# Patient Record
Sex: Male | Born: 1980 | Race: White | Hispanic: No
Health system: Southern US, Community
[De-identification: ages and names within clinical notes are randomized; demographics above are authoritative.]

## PROBLEM LIST (undated history)

## (undated) DIAGNOSIS — J45909 Unspecified asthma, uncomplicated: Secondary | ICD-10-CM

---

## 2021-06-05 ENCOUNTER — Emergency Department: Payer: Self-pay

## 2021-06-05 ENCOUNTER — Emergency Department
Admission: EM | Admit: 2021-06-05 | Discharge: 2021-06-05 | Disposition: A | Discharge status: Home / Self Care | Payer: Self-pay | Attending: Emergency Medicine | Admitting: Emergency Medicine

## 2021-06-05 ENCOUNTER — Encounter: Payer: Self-pay | Admitting: Radiology

## 2021-06-05 DIAGNOSIS — R0902 Hypoxemia: Secondary | ICD-10-CM

## 2021-06-05 DIAGNOSIS — J45901 Unspecified asthma with (acute) exacerbation: Secondary | ICD-10-CM | POA: Insufficient documentation

## 2021-06-05 DIAGNOSIS — Z20822 Contact with and (suspected) exposure to covid-19: Secondary | ICD-10-CM | POA: Insufficient documentation

## 2021-06-05 HISTORY — DX: Unspecified asthma, uncomplicated: J45.909

## 2021-06-05 LAB — CBC WITH DIFFERENTIAL/PLATELET
Abs Immature Granulocytes: 0.03 10*3/uL (ref 0.00–0.07)
Basophils Absolute: 0.1 10*3/uL (ref 0.0–0.1)
Basophils Relative: 2 %
Eosinophils Absolute: 1.6 10*3/uL — ABNORMAL HIGH (ref 0.0–0.5)
Eosinophils Relative: 23 %
HCT: 47.3 % (ref 39.0–52.0)
Hemoglobin: 16.2 g/dL (ref 13.0–17.0)
Immature Granulocytes: 0 %
Lymphocytes Relative: 35 %
Lymphs Abs: 2.3 10*3/uL (ref 0.7–4.0)
MCH: 29.8 pg (ref 26.0–34.0)
MCHC: 34.2 g/dL (ref 30.0–36.0)
MCV: 87.1 fL (ref 80.0–100.0)
Monocytes Absolute: 0.5 10*3/uL (ref 0.1–1.0)
Monocytes Relative: 7 %
Neutro Abs: 2.3 10*3/uL (ref 1.7–7.7)
Neutrophils Relative %: 33 %
Platelets: 253 10*3/uL (ref 150–400)
RBC: 5.43 MIL/uL (ref 4.22–5.81)
RDW: 12.6 % (ref 11.5–15.5)
WBC: 6.8 10*3/uL (ref 4.0–10.5)
nRBC: 0 % (ref 0.0–0.2)

## 2021-06-05 LAB — RESP PANEL BY RT-PCR (FLU A&B, COVID) ARPGX2
Influenza A by PCR: NEGATIVE
Influenza B by PCR: NEGATIVE
SARS Coronavirus 2 by RT PCR: NEGATIVE

## 2021-06-05 LAB — BASIC METABOLIC PANEL
Anion gap: 6 (ref 5–15)
BUN: 12 mg/dL (ref 6–20)
CO2: 26 mmol/L (ref 22–32)
Calcium: 9 mg/dL (ref 8.9–10.3)
Chloride: 109 mmol/L (ref 98–111)
Creatinine, Ser: 0.77 mg/dL (ref 0.61–1.24)
GFR, Estimated: >60 mL/min (ref >60)
Glucose, Bld: 147 mg/dL — ABNORMAL HIGH (ref 70–99)
Potassium: 4.4 mmol/L (ref 3.5–5.1)
Sodium: 141 mmol/L (ref 135–145)

## 2021-06-05 LAB — BLOOD GAS, ARTERIAL
Acid-base deficit: 0.7 mmol/L (ref 0.0–2.0)
Bicarbonate: 25.4 mmol/L (ref 20.0–28.0)
FIO2: 10
O2 Saturation: 98.8 %
Patient temperature: 37
pCO2 arterial: 46 mmHg (ref 32.0–48.0)
pH, Arterial: 7.35 (ref 7.350–7.450)
pO2, Arterial: 129 mmHg — ABNORMAL HIGH (ref 83.0–108.0)

## 2021-06-05 MED ORDER — ALBUTEROL SULFATE (2.5 MG/3ML) 0.083% IN NEBU
5.0000 mg | INHALATION_SOLUTION | RESPIRATORY_TRACT | Status: AC
  Administered 2021-06-05 (×3): 5 mg via RESPIRATORY_TRACT
  Filled 2021-06-05: qty 6

## 2021-06-05 MED ORDER — IPRATROPIUM BROMIDE 0.02 % IN SOLN
0.5000 mg | RESPIRATORY_TRACT | Status: AC
  Administered 2021-06-05 (×3): 0.5 mg via RESPIRATORY_TRACT
  Filled 2021-06-05: qty 2.5

## 2021-06-05 MED ORDER — ALBUTEROL SULFATE HFA 108 (90 BASE) MCG/ACT IN AERS
2.0000 | INHALATION_SPRAY | Freq: Four times a day (QID) | RESPIRATORY_TRACT | Status: DC | PRN
  Filled 2021-06-05: qty 6.7

## 2021-06-05 MED ORDER — PREDNISONE 20 MG PO TABS: 60.0000 mg | ORAL_TABLET | Freq: Every day | ORAL | 0 refills | Status: DC

## 2021-06-05 MED ORDER — ALBUTEROL SULFATE (2.5 MG/3ML) 0.083% IN NEBU: 2.5000 mg | INHALATION_SOLUTION | RESPIRATORY_TRACT | 2 refills | Status: DC | PRN

## 2021-06-05 MED ORDER — MAGNESIUM SULFATE 2 GM/50ML IV SOLN
2.0000 g | Freq: Once | INTRAVENOUS | Status: AC
  Administered 2021-06-05: 2 g via INTRAVENOUS
  Filled 2021-06-05: qty 50

## 2021-06-05 MED ORDER — SODIUM CHLORIDE 0.9 % IV BOLUS (SEPSIS)
1000.0000 mL | Freq: Once | INTRAVENOUS | Status: AC
  Administered 2021-06-05: 1000 mL via INTRAVENOUS

## 2021-06-05 MED ORDER — ALBUTEROL SULFATE (2.5 MG/3ML) 0.083% IN NEBU: 3.0000 mL | INHALATION_SOLUTION | Freq: Once | RESPIRATORY_TRACT | Status: DC

## 2021-06-05 MED ORDER — ALBUTEROL SULFATE HFA 108 (90 BASE) MCG/ACT IN AERS: 2.0000 | INHALATION_SPRAY | RESPIRATORY_TRACT | 2 refills | Status: DC | PRN

## 2021-06-05 MED ORDER — METHYLPREDNISOLONE SODIUM SUCC 125 MG IJ SOLR
125.0000 mg | Freq: Once | INTRAMUSCULAR | Status: AC
  Administered 2021-06-05: 125 mg via INTRAVENOUS
  Filled 2021-06-05: qty 2

## 2021-06-05 NOTE — Discharge Instructions (Signed)
Steps to find a Primary Care Provider (PCP):  Call [phone] or [phone] to access "[facility] Find a Doctor Service."  2.  You may also go on the [facility] website at [email or web address]  

## 2021-06-05 NOTE — ED Triage Notes (Signed)
Patient arrives via EMS for shortness of breath after waking up partner around 1:00 asking to go to pharmacy to get refill of albuterol. Symptoms rapidly progressed until patient could no longer speak in complete sentences. Patient arrives with saturation in the 80's on room air, immediately started on breathing treatment via face mask. MD at bedside for eval. Patient is awake and alert, handling own secretions, anxious and asking for help.
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
## 2021-12-28 IMAGING — DX DG CHEST 1V PORT
1 series · 1 of 1 positions shown · non-contrast
Comparison: None.

CLINICAL DATA: Shortness of breath.

EXAM:
PORTABLE CHEST 1 VIEW

[chest ap]
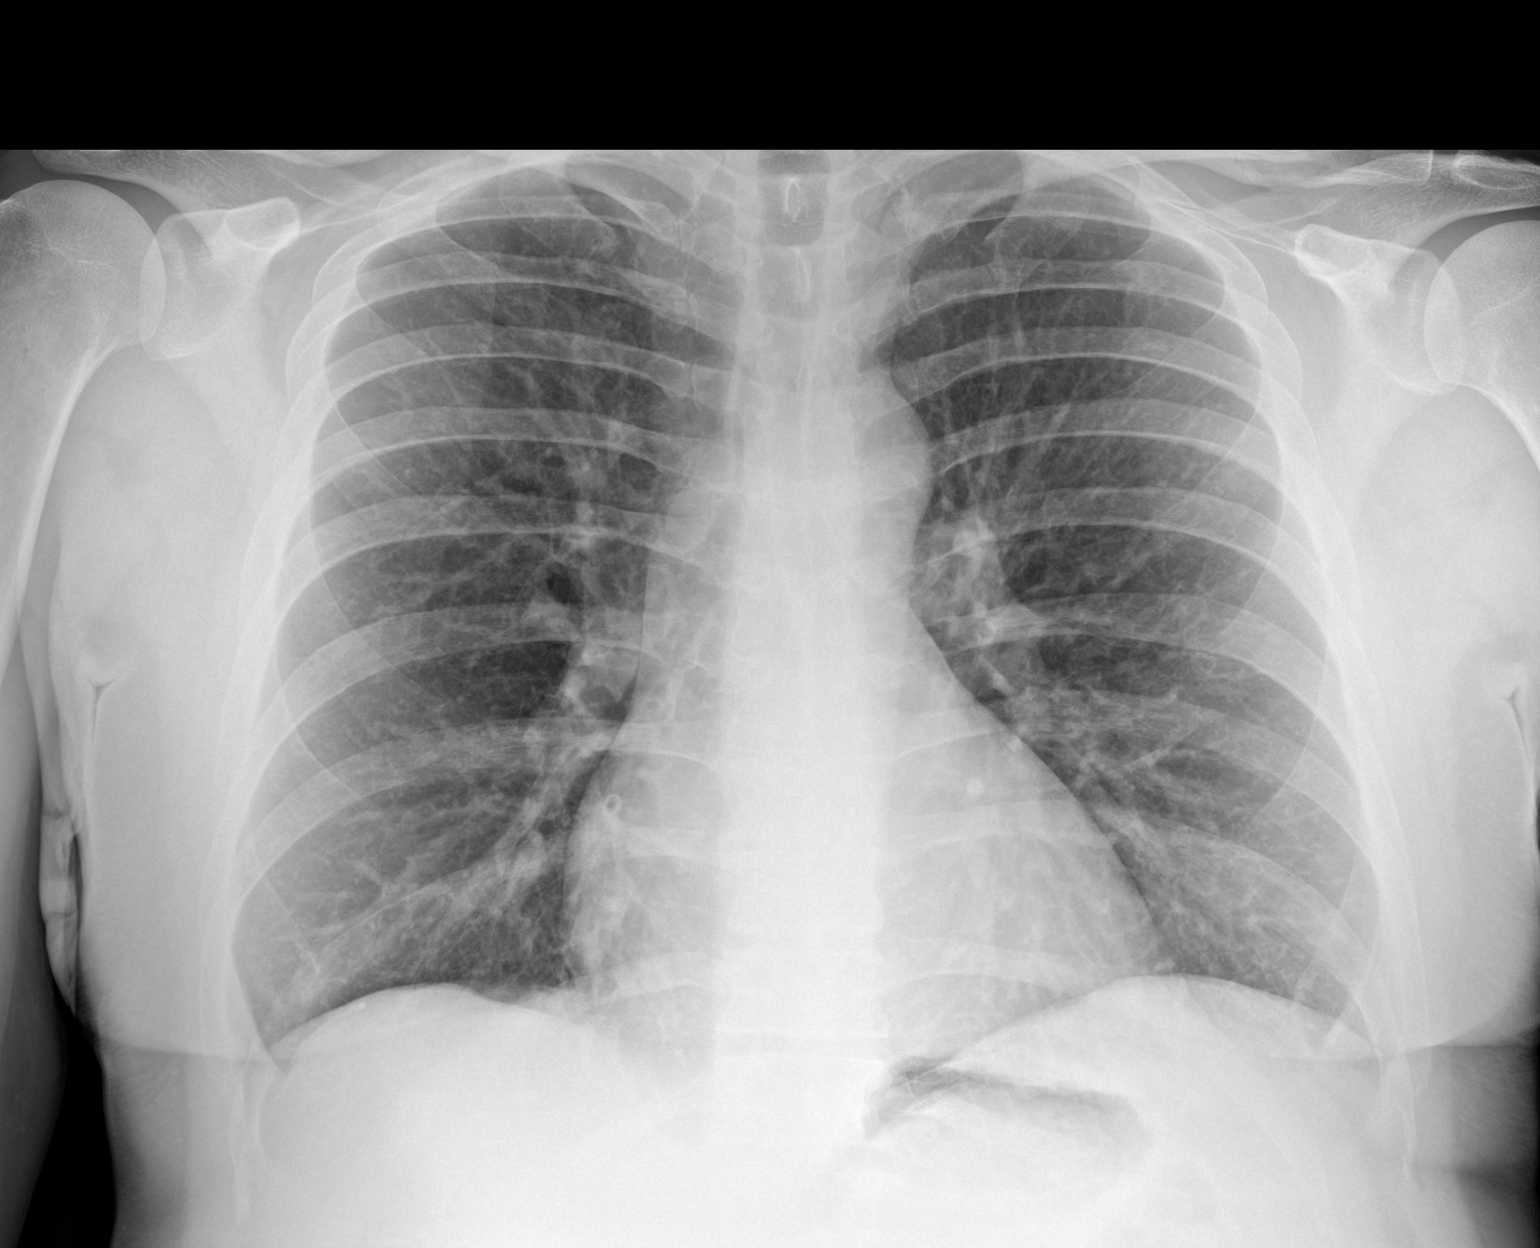

[1 of 1 positions shown; findings below may reference images not displayed]

FINDINGS: The heart size and mediastinal contours are within normal limits.
Both lungs are clear. The visualized skeletal structures are
unremarkable.
IMPRESSION: No active disease.

## 2022-05-12 ENCOUNTER — Encounter: Payer: Self-pay | Admitting: Emergency Medicine

## 2022-05-12 ENCOUNTER — Emergency Department: Payer: Self-pay

## 2022-05-12 ENCOUNTER — Inpatient Hospital Stay
Admission: EM | Admit: 2022-05-12 | Discharge: 2022-05-14 | DRG: 202 | Disposition: A | Discharge status: Home / Self Care | Payer: Self-pay | Attending: Internal Medicine | Admitting: Internal Medicine

## 2022-05-12 DIAGNOSIS — J4551 Severe persistent asthma with (acute) exacerbation: Principal | ICD-10-CM | POA: Diagnosis present

## 2022-05-12 DIAGNOSIS — Z880 Allergy status to penicillin: Secondary | ICD-10-CM

## 2022-05-12 DIAGNOSIS — Z20822 Contact with and (suspected) exposure to covid-19: Secondary | ICD-10-CM | POA: Diagnosis present

## 2022-05-12 DIAGNOSIS — J45901 Unspecified asthma with (acute) exacerbation: Secondary | ICD-10-CM

## 2022-05-12 DIAGNOSIS — J9601 Acute respiratory failure with hypoxia: Secondary | ICD-10-CM | POA: Diagnosis present

## 2022-05-12 LAB — CBC WITH DIFFERENTIAL/PLATELET
Abs Immature Granulocytes: 0.03 10*3/uL (ref 0.00–0.07)
Basophils Absolute: 0.1 10*3/uL (ref 0.0–0.1)
Basophils Relative: 1 %
Eosinophils Absolute: 0.6 10*3/uL — ABNORMAL HIGH (ref 0.0–0.5)
Eosinophils Relative: 7 %
HCT: 46.2 % (ref 39.0–52.0)
Hemoglobin: 15.7 g/dL (ref 13.0–17.0)
Immature Granulocytes: 0 %
Lymphocytes Relative: 16 %
Lymphs Abs: 1.4 10*3/uL (ref 0.7–4.0)
MCH: 29.3 pg (ref 26.0–34.0)
MCHC: 34 g/dL (ref 30.0–36.0)
MCV: 86.2 fL (ref 80.0–100.0)
Monocytes Absolute: 0.6 10*3/uL (ref 0.1–1.0)
Monocytes Relative: 7 %
Neutro Abs: 6.1 10*3/uL (ref 1.7–7.7)
Neutrophils Relative %: 69 %
Platelets: 240 10*3/uL (ref 150–400)
RBC: 5.36 MIL/uL (ref 4.22–5.81)
RDW: 12.5 % (ref 11.5–15.5)
WBC: 8.9 10*3/uL (ref 4.0–10.5)
nRBC: 0 % (ref 0.0–0.2)

## 2022-05-12 LAB — BLOOD GAS, VENOUS
Acid-base deficit: 3 mmol/L — ABNORMAL HIGH (ref 0.0–2.0)
Bicarbonate: 24.4 mmol/L (ref 20.0–28.0)
O2 Saturation: 70 %
Patient temperature: 37
pCO2, Ven: 52 mmHg (ref 44–60)
pH, Ven: 7.28 (ref 7.25–7.43)
pO2, Ven: 44 mmHg (ref 32–45)

## 2022-05-12 LAB — COMPREHENSIVE METABOLIC PANEL
ALT: 10 U/L (ref 0–44)
AST: 26 U/L (ref 15–41)
Albumin: 4.7 g/dL (ref 3.5–5.0)
Alkaline Phosphatase: 64 U/L (ref 38–126)
Anion gap: 11 (ref 5–15)
BUN: 14 mg/dL (ref 6–20)
CO2: 20 mmol/L — ABNORMAL LOW (ref 22–32)
Calcium: 8.9 mg/dL (ref 8.9–10.3)
Chloride: 106 mmol/L (ref 98–111)
Creatinine, Ser: 0.69 mg/dL (ref 0.61–1.24)
GFR, Estimated: >60 mL/min (ref >60)
Glucose, Bld: 123 mg/dL — ABNORMAL HIGH (ref 70–99)
Potassium: 4.4 mmol/L (ref 3.5–5.1)
Sodium: 137 mmol/L (ref 135–145)
Total Bilirubin: 2.1 mg/dL — ABNORMAL HIGH (ref 0.3–1.2)
Total Protein: 7.6 g/dL (ref 6.5–8.1)

## 2022-05-12 LAB — TROPONIN I (HIGH SENSITIVITY): Troponin I (High Sensitivity): 3 ng/L (ref <18)

## 2022-05-12 LAB — SARS CORONAVIRUS 2 BY RT PCR: SARS Coronavirus 2 by RT PCR: NEGATIVE

## 2022-05-12 MED ORDER — PREDNISONE 20 MG PO TABS: 60.0000 mg | ORAL_TABLET | Freq: Every day | ORAL | 0 refills | Status: DC

## 2022-05-12 MED ORDER — ALBUTEROL SULFATE (2.5 MG/3ML) 0.083% IN NEBU: 2.5000 mg | INHALATION_SOLUTION | Freq: Once | RESPIRATORY_TRACT | Status: AC

## 2022-05-12 MED ORDER — ONDANSETRON HCL 4 MG/2ML IJ SOLN
4.0000 mg | Freq: Once | INTRAMUSCULAR | Status: AC
  Administered 2022-05-12: 4 mg via INTRAVENOUS

## 2022-05-12 MED ORDER — ALBUTEROL SULFATE (2.5 MG/3ML) 0.083% IN NEBU
INHALATION_SOLUTION | RESPIRATORY_TRACT | Status: AC
  Filled 2022-05-12: qty 3

## 2022-05-12 MED ORDER — ALBUTEROL SULFATE HFA 108 (90 BASE) MCG/ACT IN AERS: 2.0000 | INHALATION_SPRAY | RESPIRATORY_TRACT | 2 refills | Status: AC | PRN

## 2022-05-12 MED ORDER — ALBUTEROL SULFATE (2.5 MG/3ML) 0.083% IN NEBU: 2.5000 mg | INHALATION_SOLUTION | RESPIRATORY_TRACT | 2 refills | Status: AC | PRN

## 2022-05-12 MED ORDER — ALBUTEROL SULFATE (2.5 MG/3ML) 0.083% IN NEBU
2.5000 mg | INHALATION_SOLUTION | Freq: Once | RESPIRATORY_TRACT | Status: AC
  Administered 2022-05-12: 2.5 mg via RESPIRATORY_TRACT
  Filled 2022-05-12: qty 3

## 2022-05-12 NOTE — ED Provider Notes (Signed)
[facility] Provider Note    Event Date/Time   First MD Initiated Contact with Patient 05/12/22 2214     (approximate)   History   Chief Complaint Respiratory Distress   HPI  Marcus Thomas is a 41 y.o. male with past medical history of asthma who presents to the ED complaining of shortness of breath.  Patient reports he has begun feeling increasingly short of breath over the course of tonight, states symptoms were initially triggered by one of his cats urinating on his carpet.  Since then, he has had a frequent dry cough and a feeling of tightness in his chest, denies any fevers.  He used a breathing treatment at home with partial relief, eventually called EMS.  EMS reports that patient initially seemed to improve with a DuoNeb, but then began to have rapidly worsening difficulty breathing.  He was given 2 g of IV magnesium along with 0.3 mg of IM epinephrine and 125 mg of IV Solu-Medrol.  He was given 2 additional DuoNebs and placed on CPAP, continues to have significant difficulty breathing on arrival.  Shortly after arrival, patient reports feeling nauseous and CPAP mask was removed so that he could vomit.  He reports being seen in the ED for his asthma before but has never required intubation or admission.     Physical Exam   Triage Vital Signs: ED Triage Vitals  Enc Vitals Group     BP      Pulse      Resp      Temp      Temp src      SpO2      Weight      Height      Head Circumference      Peak Flow      Pain Score      Pain Loc      Pain Edu?      Excl. in GC?     Most recent vital signs: Vitals:   05/12/22 2300 05/12/22 2330  BP: 133/90 139/82  Pulse: 77 70  Resp: 18 17  Temp:    SpO2: 95% 96%    Constitutional: Alert and oriented. Eyes: Conjunctivae are normal. Head: Atraumatic. Nose: No congestion/rhinnorhea. Mouth/Throat: Mucous membranes are moist.  Cardiovascular: Normal rate, regular rhythm. Grossly normal heart sounds.   2+ radial pulses bilaterally. Respiratory: Tachypneic with increased respiratory effort and accessory muscle use, very poor air movement with inspiratory and expiratory wheezing throughout. Gastrointestinal: Soft and nontender. No distention. Musculoskeletal: No lower extremity tenderness nor edema.  Neurologic:  Normal speech and language. No gross focal neurologic deficits are appreciated.    ED Results / Procedures / Treatments   Labs (all labs ordered are listed, but only abnormal results are displayed) Labs Reviewed  CBC WITH DIFFERENTIAL/PLATELET - Abnormal; Notable for the following components:      Result Value   Eosinophils Absolute 0.6 (*)    All other components within normal limits  COMPREHENSIVE METABOLIC PANEL - Abnormal; Notable for the following components:   CO2 20 (*)    Glucose, Bld 123 (*)    Total Bilirubin 2.1 (*)    All other components within normal limits  BLOOD GAS, VENOUS - Abnormal; Notable for the following components:   Acid-base deficit 3.0 (*)    All other components within normal limits  SARS CORONAVIRUS 2 BY RT PCR  TROPONIN I (HIGH SENSITIVITY)     EKG  ED ECG REPORT I,  Chesley Noon, the attending physician, personally viewed and interpreted this ECG.   Date: 05/12/2022  EKG Time: 22:24  Rate: 76  Rhythm: normal sinus rhythm  Axis: Normal  Intervals:none  ST&T Change: None  RADIOLOGY Chest x-ray reviewed and interpreted by me with no infiltrate, edema, or effusion.  PROCEDURES:  Critical Care performed: No  Procedures   MEDICATIONS ORDERED IN ED: Medications  albuterol (PROVENTIL) (2.5 MG/3ML) 0.083% nebulizer solution 2.5 mg ( Nebulization Given 05/12/22 2219)  ondansetron (ZOFRAN) injection 4 mg (4 mg Intravenous Given 05/12/22 2228)  albuterol (PROVENTIL) (2.5 MG/3ML) 0.083% nebulizer solution 2.5 mg (2.5 mg Nebulization Given 05/12/22 2308)     IMPRESSION / MDM / ASSESSMENT AND PLAN / ED COURSE  I reviewed the triage  vital signs and the nursing notes.                              41 y.o. male with past medical history of asthma who presents to the ED with increasing difficulty breathing over the course of this evening with dry cough.  Patient's presentation is most consistent with acute presentation with potential threat to life or bodily function.  Differential diagnosis includes, but is not limited to, ACS, PE, pneumonia, pneumothorax, asthma exacerbation, CHF.  Patient ill-appearing and in moderate respiratory distress on arrival, required removal of CPAP mask so that he could vomit.  Given active vomiting, patient was not placed on BiPAP and instead started on 4 L nasal cannula with plan for additional albuterol administration.  His work of breathing gradually improved with albuterol, VBG reassuring without significant hypercapnia.  Chest x-ray is unremarkable, EKG shows no evidence of arrhythmia or ischemia and troponin within normal limits.  Remainder of labs are reassuring without significant anemia, leukocytosis, electrolyte abnormality, or AKI.  Symptoms seem consistent with asthma exacerbation, no reason to suspect PE or ACS at this time.  We have been gradually able to wean him off supplemental oxygen and his respiratory rate is much improved.  He will require additional observation given IM epinephrine administration and severity of apparent asthma exacerbation.  Patient turned over to oncoming provider pending reassessment.      FINAL CLINICAL IMPRESSION(S) / ED DIAGNOSES   Final diagnoses:  Severe asthma with exacerbation, unspecified whether persistent     Rx / DC Orders   ED Discharge Orders          Ordered    albuterol (VENTOLIN HFA) 108 (90 Base) MCG/ACT inhaler  Every 4 hours PRN        05/12/22 2356    albuterol (PROVENTIL) (2.5 MG/3ML) 0.083% nebulizer solution  Every 4 hours PRN        05/12/22 2356    predniSONE (DELTASONE) 20 MG tablet  Daily        05/12/22 2356              Note:  This document was prepared using Dragon voice recognition software and may include unintentional dictation errors.   Chesley Noon, MD 05/13/22 [phone]

## 2022-05-12 NOTE — ED Triage Notes (Signed)
Pt presents via EMS for respiratory distress that started today with audible wheezing. Pt received IM epi, 3 duo-nebs; 2mg  mag, and solu-medrol with EMS. Pt arrives on CPAP with EMS - endorses nausea with the mask - pt switched to 4L [place]. Denies CP.

## 2022-05-12 NOTE — ED Provider Notes (Signed)
11:44 PM  Assumed care at shift change.  Patient here for asthma exacerbation.  Received epinephrine IM with EMS and will need observation for 4 hours.   2:42 AM  Pt still looks much better clinically than he did on arrival per the previous providers report.  He states he is feeling better but unfortunately after another 2 breathing treatments his oxygen saturation at rest still drops to 87% with him sitting upright in the bed and awake.  Immediately improved to 99% on 2 L nasal cannula.  He does not wear oxygen chronically.  Given his acute respiratory failure, will admit for asthma exacerbation.   2:50 AM  Consulted and discussed patient's case with hospitalist, Dr. Arville Care.  I have recommended admission and consulting physician agrees and will place admission orders.  Patient (and family if present) agree with this plan.   I reviewed all nursing notes, vitals, pertinent previous records.  All labs, EKGs, imaging ordered have been independently reviewed and interpreted by myself.     CRITICAL CARE Performed by: Rochele Raring   Total critical care time: 35 minutes  Critical care time was exclusive of separately billable procedures and treating other patients.  Critical care was necessary to treat or prevent imminent or life-threatening deterioration.  Critical care was time spent personally by me on the following activities: development of treatment plan with patient and/or surrogate as well as nursing, discussions with consultants, evaluation of patient's response to treatment, examination of patient, obtaining history from patient or surrogate, ordering and performing treatments and interventions, ordering and review of laboratory studies, ordering and review of radiographic studies, pulse oximetry and re-evaluation of patient's condition.    Jaylena Holloway, Layla Maw, DO 05/13/22 [phone]

## 2022-05-12 NOTE — Discharge Instructions (Addendum)
Cont your nebulizer and use of rescue inhaler as before

## 2022-05-13 DIAGNOSIS — J9601 Acute respiratory failure with hypoxia: Secondary | ICD-10-CM

## 2022-05-13 DIAGNOSIS — J45901 Unspecified asthma with (acute) exacerbation: Secondary | ICD-10-CM

## 2022-05-13 DIAGNOSIS — J4521 Mild intermittent asthma with (acute) exacerbation: Secondary | ICD-10-CM

## 2022-05-13 HISTORY — DX: Unspecified asthma with (acute) exacerbation: J45.901

## 2022-05-13 LAB — CBC
HCT: 45.8 % (ref 39.0–52.0)
Hemoglobin: 15.8 g/dL (ref 13.0–17.0)
MCH: 29.5 pg (ref 26.0–34.0)
MCHC: 34.5 g/dL (ref 30.0–36.0)
MCV: 85.6 fL (ref 80.0–100.0)
Platelets: 259 10*3/uL (ref 150–400)
RBC: 5.35 MIL/uL (ref 4.22–5.81)
RDW: 12.4 % (ref 11.5–15.5)
WBC: 10.1 10*3/uL (ref 4.0–10.5)
nRBC: 0 % (ref 0.0–0.2)

## 2022-05-13 LAB — HIV ANTIBODY (ROUTINE TESTING W REFLEX): HIV Screen 4th Generation wRfx: NONREACTIVE

## 2022-05-13 LAB — BASIC METABOLIC PANEL
Anion gap: 12 (ref 5–15)
BUN: 17 mg/dL (ref 6–20)
CO2: 22 mmol/L (ref 22–32)
Calcium: 9.5 mg/dL (ref 8.9–10.3)
Chloride: 104 mmol/L (ref 98–111)
Creatinine, Ser: 0.85 mg/dL (ref 0.61–1.24)
GFR, Estimated: >60 mL/min (ref >60)
Glucose, Bld: 151 mg/dL — ABNORMAL HIGH (ref 70–99)
Potassium: 3.8 mmol/L (ref 3.5–5.1)
Sodium: 138 mmol/L (ref 135–145)

## 2022-05-13 MED ORDER — TRAZODONE HCL 50 MG PO TABS: 25.0000 mg | ORAL_TABLET | Freq: Every evening | ORAL | Status: DC | PRN

## 2022-05-13 MED ORDER — ACETAMINOPHEN 325 MG PO TABS: 650.0000 mg | ORAL_TABLET | Freq: Four times a day (QID) | ORAL | Status: DC | PRN

## 2022-05-13 MED ORDER — SODIUM CHLORIDE 0.9 % IV SOLN: INTRAVENOUS | Status: DC

## 2022-05-13 MED ORDER — ONDANSETRON HCL 4 MG PO TABS: 4.0000 mg | ORAL_TABLET | Freq: Four times a day (QID) | ORAL | Status: DC | PRN

## 2022-05-13 MED ORDER — ALBUTEROL SULFATE (2.5 MG/3ML) 0.083% IN NEBU
5.0000 mg | INHALATION_SOLUTION | Freq: Once | RESPIRATORY_TRACT | Status: AC
  Administered 2022-05-13: 5 mg via RESPIRATORY_TRACT
  Filled 2022-05-13: qty 6

## 2022-05-13 MED ORDER — ONDANSETRON HCL 4 MG/2ML IJ SOLN
4.0000 mg | Freq: Four times a day (QID) | INTRAMUSCULAR | Status: DC | PRN
  Administered 2022-05-13: 4 mg via INTRAVENOUS
  Filled 2022-05-13: qty 2

## 2022-05-13 MED ORDER — IPRATROPIUM BROMIDE 0.02 % IN SOLN
0.5000 mg | Freq: Once | RESPIRATORY_TRACT | Status: AC
  Administered 2022-05-13: 0.5 mg via RESPIRATORY_TRACT
  Filled 2022-05-13: qty 2.5

## 2022-05-13 MED ORDER — METHYLPREDNISOLONE SODIUM SUCC 125 MG IJ SOLR
80.0000 mg | INTRAMUSCULAR | Status: DC
  Administered 2022-05-13 – 2022-05-14 (×2): 80 mg via INTRAVENOUS
  Filled 2022-05-13 (×2): qty 2

## 2022-05-13 MED ORDER — MAGNESIUM HYDROXIDE 400 MG/5ML PO SUSP: 30.0000 mL | Freq: Every day | ORAL | Status: DC | PRN

## 2022-05-13 MED ORDER — IPRATROPIUM-ALBUTEROL 0.5-2.5 (3) MG/3ML IN SOLN
3.0000 mL | Freq: Four times a day (QID) | RESPIRATORY_TRACT | Status: DC
  Administered 2022-05-13 (×4): 3 mL via RESPIRATORY_TRACT
  Filled 2022-05-13 (×5): qty 3

## 2022-05-13 MED ORDER — ACETAMINOPHEN 650 MG RE SUPP: 650.0000 mg | Freq: Four times a day (QID) | RECTAL | Status: DC | PRN

## 2022-05-13 MED ORDER — ENOXAPARIN SODIUM 40 MG/0.4ML IJ SOSY
40.0000 mg | PREFILLED_SYRINGE | INTRAMUSCULAR | Status: DC
  Administered 2022-05-13 – 2022-05-14 (×2): 40 mg via SUBCUTANEOUS
  Filled 2022-05-13 (×2): qty 0.4

## 2022-05-13 NOTE — Plan of Care (Signed)

## 2022-05-13 NOTE — H&P (Signed)
[place]   PATIENT NAME: Marcus Thomas    MR#:  [id number]  DATE OF BIRTH:  July 13, 1981  DATE OF ADMISSION:  05/12/2022  PRIMARY CARE PHYSICIAN: Pcp, No   Patient is coming from: Home  REQUESTING/REFERRING PHYSICIAN: Ward, Layla Maw, DO  CHIEF COMPLAINT:   Chief Complaint  Patient presents with   Respiratory Distress    HISTORY OF PRESENT ILLNESS:  Marcus Thomas is a 41 y.o. Caucasian male with medical history significant for asthma, presented to emergency room with acute onset of worsening dyspnea with associated cough productive of clear sputum as well as wheezing since this afternoon.  He was initially having chest tightness associated with his dyspnea.  He used DuoNebs several times at home.  His pulse oximetry was down to 87% on room air per EMS.  He was given nebulized bronchodilator therapy as well as IV Solu-Medrol and IV mag sulfate in addition to subcutaneous epinephrine per EMS.  He denies any fever or chills.  No chest pain or palpitations.  He had nausea and vomiting once without diarrhea or abdominal pain.  No bilious vomitus or hematemesis.   ED Course: When he came to the ER, BP was 140/99 and later 160/105 with respiratory to 30 and pulse currently of 100% on 4 L O2 by nasal cannula.  Labs revealed VBG with pH 7.28 and HCO3 24.4.  CBC was within normal and CMP showed CO2 of 20 and total bili of 2.1 and was otherwise unremarkable.  COVID-19 PCR came back negative. EKG as reviewed by me : Showed sinus rhythm with a rate of 76 with probable left atrial enlargement and nonspecific intraventricular conduction delay. Imaging: Portable chest ray showed no acute cardiopulmonary disease.  The patient was given nebulized albuterol twice with 2.5 mg and twice with 5 mg continuous neb.  He was also given Atrovent nebulizer therapy as well as 4 mg of IV Zofran.  He will be admitted to a medical telemetry bed for further evaluation and management.Marland Kitchen PAST MEDICAL HISTORY:    Past Medical History:  Diagnosis Date   Asthma     PAST SURGICAL HISTORY:  History reviewed. No pertinent surgical history.  He denies any previous surgeries.  SOCIAL HISTORY:   Social History   Tobacco Use   Smoking status: Not on file   Smokeless tobacco: Not on file  Substance Use Topics   Alcohol use: Not on file   No history of tobacco or EtOH abuse or illicit drug use. FAMILY HISTORY:  Positive for diabetes mellitus in his father.   DRUG ALLERGIES:   Allergies  Allergen Reactions   Penicillins Anaphylaxis    REVIEW OF SYSTEMS:   ROS As per history of present illness. All pertinent systems were reviewed above. Constitutional, HEENT, cardiovascular, respiratory, GI, GU, musculoskeletal, neuro, psychiatric, endocrine, integumentary and hematologic systems were reviewed and are otherwise negative/unremarkable except for positive findings mentioned above in the HPI.   MEDICATIONS AT HOME:   Prior to Admission medications   Medication Sig Start Date End Date Taking? Authorizing Provider  albuterol (PROVENTIL) (2.5 MG/3ML) 0.083% nebulizer solution Take 3 mLs (2.5 mg total) by nebulization every 4 (four) hours as needed for wheezing or shortness of breath. 05/12/22 05/12/23  Chesley Noon, MD  albuterol (VENTOLIN HFA) 108 (90 Base) MCG/ACT inhaler Inhale 2 puffs into the lungs every 4 (four) hours as needed for wheezing or shortness of breath. 05/12/22   Chesley Noon, MD  predniSONE (DELTASONE) 20 MG tablet  Take 3 tablets (60 mg total) by mouth daily for 5 days. 05/12/22 05/17/22  Chesley Noon, MD      VITAL SIGNS:  Blood pressure (!) 147/85, pulse 66, temperature 98.6 F (37 C), temperature source Oral, resp. rate 20, height 5\' 7"  (1.702 m), weight 88 kg, SpO2 96 %.  PHYSICAL EXAMINATION:  Physical Exam  GENERAL:  41 y.o.-year-old patient lying in the bed with no acute distress.  EYES: Pupils equal, round, reactive to light and accommodation. No scleral  icterus. Extraocular muscles intact.  HEENT: Head atraumatic, normocephalic. Oropharynx and nasopharynx clear.  NECK:  Supple, no jugular venous distention. No thyroid enlargement, no tenderness.  LUNGS: Diffuse expiratory wheezes with tight expiratory airflow and harsh vesicular breathing.  CARDIOVASCULAR: Regular rate and rhythm, S1, S2 normal. No murmurs, rubs, or gallops.  ABDOMEN: Soft, nondistended, nontender. Bowel sounds present. No organomegaly or mass.  EXTREMITIES: No pedal edema, cyanosis, or clubbing.  NEUROLOGIC: Cranial nerves II through XII are intact. Muscle strength 5/5 in all extremities. Sensation intact. Gait not checked.  PSYCHIATRIC: The patient is alert and oriented x 3.  Normal affect and good eye contact. SKIN: No obvious rash, lesion, or ulcer.   LABORATORY PANEL:   CBC Recent Labs  Lab 05/13/22 0405  WBC 10.1  HGB 15.8  HCT 45.8  PLT 259   ------------------------------------------------------------------------------------------------------------------  Chemistries  Recent Labs  Lab 05/12/22 2215 05/13/22 0405  NA 137 138  K 4.4 3.8  CL 106 104  CO2 20* 22  GLUCOSE 123* 151*  BUN 14 17  CREATININE 0.69 0.85  CALCIUM 8.9 9.5  AST 26  --   ALT 10  --   ALKPHOS 64  --   BILITOT 2.1*  --    ------------------------------------------------------------------------------------------------------------------  Cardiac Enzymes No results for input(s): "TROPONINI" in the last 168 hours. ------------------------------------------------------------------------------------------------------------------  RADIOLOGY:  DG Chest Portable 1 View  Result Date: 05/12/2022 CLINICAL DATA:  Shortness of breath EXAM: PORTABLE CHEST 1 VIEW COMPARISON:  06/05/2021 FINDINGS: The heart size and mediastinal contours are within normal limits. Both lungs are clear. The visualized skeletal structures are unremarkable. IMPRESSION: No active disease. Electronically Signed    By: 06/07/2021 M.D.   On: 05/12/2022 23:08      IMPRESSION AND PLAN:  Assessment and Plan: * Acute asthma exacerbation - The patient will be admitted to a medical telemetry bed. - This notes acute severe persistent asthma with exacerbation. - We will continue steroid therapy with IV Solu-Medrol. - Continue bronchodilator therapy with DuoNebs 4 times daily and every 4 hours as needed. - Mucolytic therapy will be provided as well as antitussive therapy.  Acute respiratory failure with hypoxia (HCC) - This is clearly secondary to his acute asthma exacerbation. - O2 protocol will be followed. - O2 will be tapered off as tolerated.   DVT prophylaxis: Lovenox.  Advanced Care Planning:  Code Status: full code.  Family Communication:  The plan of care was discussed in details with the patient (and family). I answered all questions. The patient agreed to proceed with the above mentioned plan. Further management will depend upon hospital course. Disposition Plan: Back to previous home environment Consults called: none.  All the records are reviewed and case discussed with ED provider.  Status: Inpatient   At the time of the admission, it appears that the appropriate admission status for this patient is inpatient.  This is judged to be reasonable and necessary in order to provide the required intensity of service to  ensure the patient's safety given the presenting symptoms, physical exam findings and initial radiographic and laboratory data in the context of comorbid conditions.  The patient requires inpatient status due to high intensity of service, high risk of further deterioration and high frequency of surveillance required.  I certify that at the time of admission, it is my clinical judgment that the patient will require inpatient hospital care extending more than 2 midnights.                            Dispo: The patient is from: Home              Anticipated d/c is to: Home               Patient currently is not medically stable to d/c.              Difficult to place patient: No  Hannah Beat M.D on 05/13/2022 at 7:46 AM  [facility]   From 7 PM-7 AM, contact night-coverage [email or web address]  CC: Primary care physician; Pcp, No

## 2022-05-13 NOTE — Progress Notes (Signed)
Patient seen chart reviewed. Admitted early hours of the morning with increasing disease and shortness of breath. Patient will probably feel CED held some catheter at home started noticing shortness of breath. He also went out Heat/humid weather which could have triggered his asthma. Currently on room air sats 93%. I cannot hear any wheezing. Will get scheduled nebulizer IV Solu-Medrol. No indication of antibiotics. If remains stable discharged tomorrow. Patient is in agreement with plan. No family at bedside.  No charge

## 2022-05-13 NOTE — Assessment & Plan Note (Addendum)
-   The patient will be admitted to a medical telemetry bed. - This notes acute severe persistent asthma with exacerbation. - We will continue steroid therapy with IV Solu-Medrol. - Continue bronchodilator therapy with DuoNebs 4 times daily and every 4 hours as needed. - Mucolytic therapy will be provided as well as antitussive therapy.

## 2022-05-13 NOTE — Assessment & Plan Note (Signed)
-   This is clearly secondary to his acute asthma exacerbation. - O2 protocol will be followed. - O2 will be tapered off as tolerated.

## 2022-05-14 ENCOUNTER — Encounter: Payer: Self-pay | Admitting: Family Medicine

## 2022-05-14 MED ORDER — IPRATROPIUM-ALBUTEROL 0.5-2.5 (3) MG/3ML IN SOLN
3.0000 mL | RESPIRATORY_TRACT | Status: DC | PRN
Start: 2022-05-14 — End: 2022-05-14
  Administered 2022-05-14 (×2): 3 mL via RESPIRATORY_TRACT
  Filled 2022-05-14: qty 3

## 2022-05-14 MED ORDER — PREDNISONE 10 MG PO TABS: ORAL_TABLET | ORAL | 0 refills | Status: AC

## 2022-05-14 MED ORDER — PREDNISONE 50 MG PO TABS: 50.0000 mg | ORAL_TABLET | Freq: Every day | ORAL | Status: DC

## 2022-05-14 NOTE — Plan of Care (Signed)

## 2022-05-14 NOTE — Plan of Care (Signed)
  Problem: Education: Goal: Knowledge of General Education information will improve Description: Including pain rating scale, medication(s)/side effects and non-pharmacologic comfort measures 05/14/2022 0829 by Bing Quarry, LPN Outcome: Adequate for Discharge 05/14/2022 0828 by Bing Quarry, LPN Outcome: Progressing   Problem: Health Behavior/Discharge Planning: Goal: Ability to manage health-related needs will improve 05/14/2022 0829 by Bing Quarry, LPN Outcome: Adequate for Discharge 05/14/2022 0828 by Bing Quarry, LPN Outcome: Progressing   Problem: Clinical Measurements: Goal: Ability to maintain clinical measurements within normal limits will improve 05/14/2022 0829 by Bing Quarry, LPN Outcome: Adequate for Discharge 05/14/2022 0828 by Bing Quarry, LPN Outcome: Progressing Goal: Will remain free from infection 05/14/2022 0829 by Bing Quarry, LPN Outcome: Adequate for Discharge 05/14/2022 0828 by Bing Quarry, LPN Outcome: Progressing Goal: Diagnostic test results will improve 05/14/2022 0829 by Bing Quarry, LPN Outcome: Adequate for Discharge 05/14/2022 0828 by Bing Quarry, LPN Outcome: Progressing Goal: Respiratory complications will improve 05/14/2022 0829 by Bing Quarry, LPN Outcome: Adequate for Discharge 05/14/2022 0828 by Bing Quarry, LPN Outcome: Progressing Goal: Cardiovascular complication will be avoided 05/14/2022 0829 by Bing Quarry, LPN Outcome: Adequate for Discharge 05/14/2022 0828 by Bing Quarry, LPN Outcome: Progressing   Problem: Activity: Goal: Risk for activity intolerance will decrease 05/14/2022 0829 by Bing Quarry, LPN Outcome: Adequate for Discharge 05/14/2022 0828 by Bing Quarry, LPN Outcome: Progressing   Problem: Nutrition: Goal: Adequate nutrition will be maintained 05/14/2022 0829 by Bing Quarry, LPN Outcome: Adequate for Discharge 05/14/2022 0828 by Bing Quarry, LPN Outcome: Progressing    Problem: Coping: Goal: Level of anxiety will decrease 05/14/2022 0829 by Bing Quarry, LPN Outcome: Adequate for Discharge 05/14/2022 0828 by Bing Quarry, LPN Outcome: Progressing   Problem: Elimination: Goal: Will not experience complications related to bowel motility 05/14/2022 0829 by Bing Quarry, LPN Outcome: Adequate for Discharge 05/14/2022 0828 by Bing Quarry, LPN Outcome: Progressing Goal: Will not experience complications related to urinary retention 05/14/2022 0829 by Bing Quarry, LPN Outcome: Adequate for Discharge 05/14/2022 0828 by Bing Quarry, LPN Outcome: Progressing   Problem: Pain Managment: Goal: General experience of comfort will improve 05/14/2022 0829 by Bing Quarry, LPN Outcome: Adequate for Discharge 05/14/2022 0828 by Bing Quarry, LPN Outcome: Progressing   Problem: Safety: Goal: Ability to remain free from injury will improve 05/14/2022 0829 by Bing Quarry, LPN Outcome: Adequate for Discharge 05/14/2022 0828 by Bing Quarry, LPN Outcome: Progressing   Problem: Skin Integrity: Goal: Risk for impaired skin integrity will decrease 05/14/2022 0829 by Bing Quarry, LPN Outcome: Adequate for Discharge 05/14/2022 0828 by Bing Quarry, LPN Outcome: Progressing

## 2022-05-14 NOTE — Discharge Summary (Signed)
Physician Discharge Summary   Patient: Marcus Thomas MRN: [id number] DOB: [birth year]  Admit date:     05/12/2022  Discharge date: 05/14/22  Discharge Physician: Enedina Finner   PCP: Pcp, No   Recommendations at discharge:    Use your inhaler and nebs as before  Discharge Diagnoses: Acute Asthma exacerbation  Hospital Course:  Marcus Thomas is a 41 y.o. Caucasian male with medical history significant for asthma, presented to emergency room with acute onset of worsening dyspnea with associated cough productive of clear sputum as well as wheezing since this afternoon.  He was initially having chest tightness associated with his dyspnea.  He used DuoNebs several times at home.  His pulse oximetry was down to 87% on room air per EMS  Acute asthma exacerbation - recieved IV Solu-Medrol--change to po steroid taper - Continue bronchodilator therapy with DuoNebs 4 times daily and every 4 hours as needed. - Mucolytic therapy will be provided as well as antitussive therapy. --no wheezing --pt is at baseline --cont loratidine and nebs/inhalers as before   Acute respiratory failure with hypoxia (HCC)--resolved - clearly secondary to his acute asthma exacerbation. - weaned off oxygen --sats 96-97% on RA  D/c home. Pt agreeable      Disposition: Home Diet recommendation:  Regular diet DISCHARGE MEDICATION: Allergies as of 05/14/2022       Reactions   Penicillins Anaphylaxis        Medication List     TAKE these medications    albuterol 108 (90 Base) MCG/ACT inhaler Commonly known as: VENTOLIN HFA Inhale 2 puffs into the lungs every 4 (four) hours as needed for wheezing or shortness of breath.   albuterol (2.5 MG/3ML) 0.083% nebulizer solution Commonly known as: PROVENTIL Take 3 mLs (2.5 mg total) by nebulization every 4 (four) hours as needed for wheezing or shortness of breath.   loratadine 10 MG tablet Commonly known as: CLARITIN Take 10 mg by mouth daily.    predniSONE 10 MG tablet Commonly known as: DELTASONE Take 50 mg daily--taper by 10 mg daily then stop Start taking on: May 15, 2022 What changed:  medication strength how much to take how to take this when to take this additional instructions        Follow-up Information     [facility] EMERGENCY DEPARTMENT .   Specialty: Emergency Medicine Why: If symptoms worsen Contact information: [address] [id number] [place] [address] [phone]        [facility]. Schedule an appointment as soon as possible for a visit .   Specialty: Primary Care Contact information: [address] [place] [address] [phone]               Discharge Exam: Ceasar Mons Weights   05/12/22 2227  Weight: 88 kg     Condition at discharge: fair  The results of significant diagnostics from this hospitalization (including imaging, microbiology, ancillary and laboratory) are listed below for reference.   Imaging Studies: DG Chest Portable 1 View  Result Date: 05/12/2022 CLINICAL DATA:  Shortness of breath EXAM: PORTABLE CHEST 1 VIEW COMPARISON:  06/05/2021 FINDINGS: The heart size and mediastinal contours are within normal limits. Both lungs are clear. The visualized skeletal structures are unremarkable. IMPRESSION: No active disease. Electronically Signed   By: Alcide Clever M.D.   On: 05/12/2022 23:08    Microbiology: Results for orders placed or performed during the hospital encounter of 05/12/22  SARS Coronavirus 2  by RT PCR (hospital order, performed in [facility] hospital lab) *cepheid single result test* Anterior Nasal Swab     Status: None   Collection Time: 05/12/22 10:16 PM   Specimen: Anterior Nasal Swab  Result Value Ref Range Status   SARS Coronavirus 2 by RT PCR NEGATIVE NEGATIVE Final    Comment: (NOTE) SARS-CoV-2 target nucleic acids are NOT DETECTED.  The SARS-CoV-2 RNA is  generally detectable in upper and lower respiratory specimens during the acute phase of infection. The lowest concentration of SARS-CoV-2 viral copies this assay can detect is 250 copies / mL. A negative result does not preclude SARS-CoV-2 infection and should not be used as the sole basis for treatment or other patient management decisions.  A negative result may occur with improper specimen collection / handling, submission of specimen other than nasopharyngeal swab, presence of viral mutation(s) within the areas targeted by this assay, and inadequate number of viral copies (<250 copies / mL). A negative result must be combined with clinical observations, patient history, and epidemiological information.  Fact Sheet for Patients:   RoadLapTop.co.za  Fact Sheet for Healthcare Providers: http://kim-miller.com/  This test is not yet approved or  cleared by the [place] FDA and has been authorized for detection and/or diagnosis of SARS-CoV-2 by FDA under an Emergency Use Authorization (EUA).  This EUA will remain in effect (meaning this test can be used) for the duration of the COVID-19 declaration under Section 564(b)(1) of the Act, 21 U.S.C. section 360bbb-3(b)(1), unless the authorization is terminated or revoked sooner.  Performed at [facility], [address] [place], [place] [address]     Labs: CBC: Recent Labs  Lab 05/12/22 2215 05/13/22 0405  WBC 8.9 10.1  NEUTROABS 6.1  --   HGB 15.7 15.8  HCT 46.2 45.8  MCV 86.2 85.6  PLT 240 259   Basic Metabolic Panel: Recent Labs  Lab 05/12/22 2215 05/13/22 0405  NA 137 138  K 4.4 3.8  CL 106 104  CO2 20* 22  GLUCOSE 123* 151*  BUN 14 17  CREATININE 0.69 0.85  CALCIUM 8.9 9.5   Liver Function Tests: Recent Labs  Lab 05/12/22 2215  AST 26  ALT 10  ALKPHOS 64  BILITOT 2.1*  PROT 7.6  ALBUMIN 4.7   CBG: No results for input(s): "GLUCAP" in the  last 168 hours.  Discharge time spent: greater than 30 minutes.  Signed: Enedina Finner, MD [facility] 05/14/2022

## 2022-05-14 NOTE — Progress Notes (Signed)
       CROSS COVER NOTE  NAME: Bertrand Vowels MRN: [id number] DOB : 1981-06-19    Date of Service   05/14/2022  HPI/Events of Note   Patient request received for breathing treatment for shortness of breath. Mr Newey is a 41 yo M admitted with an acute asthma exacerbation. He has scheduled duonebs order with admin instructions to administer q4H PRN.  Interventions   Plan: Order placed for q4H PRN Duonebs     This document was prepared using Dragon voice recognition software and may include unintentional dictation errors.  Bishop Limbo DNP, MHA, FNP-BC Nurse Practitioner [facility] Pager [phone]
